# Patient Record
Sex: Female | Born: 1940 | Race: Black or African American | Hispanic: No | Marital: Single | Smoking: Former smoker
Health system: Southern US, Community
[De-identification: ages and names within clinical notes are randomized; demographics above are authoritative.]

## PROBLEM LIST (undated history)

## (undated) DIAGNOSIS — F0390 Unspecified dementia without behavioral disturbance: Secondary | ICD-10-CM

## (undated) DIAGNOSIS — I10 Essential (primary) hypertension: Secondary | ICD-10-CM

---

## 1999-07-03 ENCOUNTER — Ambulatory Visit (HOSPITAL_COMMUNITY): Admission: RE | Admit: 1999-07-03 | Discharge: 1999-07-03 | Payer: Self-pay | Admitting: *Deleted

## 2011-04-12 ENCOUNTER — Emergency Department: Payer: Self-pay | Admitting: Emergency Medicine

## 2012-06-12 ENCOUNTER — Emergency Department: Payer: Self-pay | Admitting: Emergency Medicine

## 2012-10-20 ENCOUNTER — Emergency Department: Payer: Self-pay | Admitting: Emergency Medicine

## 2012-10-20 LAB — URINALYSIS, COMPLETE
Bilirubin,UR: NEGATIVE
Blood: NEGATIVE
Glucose,UR: NEGATIVE mg/dL (ref 0–75)
Ketone: NEGATIVE
Leukocyte Esterase: NEGATIVE
Nitrite: NEGATIVE
Ph: 6 (ref 4.5–8.0)
Protein: NEGATIVE
RBC,UR: <1 /HPF (ref 0–5)
Specific Gravity: 1.018 (ref 1.003–1.030)
Squamous Epithelial: 1
WBC UR: 1 /HPF (ref 0–5)

## 2012-10-20 LAB — COMPREHENSIVE METABOLIC PANEL
Albumin: 3.5 g/dL (ref 3.4–5.0)
Alkaline Phosphatase: 69 U/L (ref 50–136)
Anion Gap: 6 — ABNORMAL LOW (ref 7–16)
BUN: 14 mg/dL (ref 7–18)
Bilirubin,Total: 0.3 mg/dL (ref 0.2–1.0)
Calcium, Total: 9 mg/dL (ref 8.5–10.1)
Chloride: 107 mmol/L (ref 98–107)
Co2: 26 mmol/L (ref 21–32)
Creatinine: 0.65 mg/dL (ref 0.60–1.30)
EGFR (African American): >60
EGFR (Non-African Amer.): >60
Glucose: 100 mg/dL — ABNORMAL HIGH (ref 65–99)
Osmolality: 278 (ref 275–301)
Potassium: 3.9 mmol/L (ref 3.5–5.1)
SGOT(AST): 22 U/L (ref 15–37)
SGPT (ALT): 16 U/L (ref 12–78)
Sodium: 139 mmol/L (ref 136–145)
Total Protein: 7.7 g/dL (ref 6.4–8.2)

## 2012-10-20 LAB — CBC
HCT: 35.7 % (ref 35.0–47.0)
HGB: 11.2 g/dL — ABNORMAL LOW (ref 12.0–16.0)
MCH: 22.5 pg — ABNORMAL LOW (ref 26.0–34.0)
MCHC: 31.3 g/dL — ABNORMAL LOW (ref 32.0–36.0)
MCV: 72 fL — ABNORMAL LOW (ref 80–100)
Platelet: 318 10*3/uL (ref 150–440)
RBC: 4.97 10*6/uL (ref 3.80–5.20)
RDW: 15.1 % — ABNORMAL HIGH (ref 11.5–14.5)
WBC: 9.2 10*3/uL (ref 3.6–11.0)

## 2012-10-20 LAB — TROPONIN I: Troponin-I: <0.02 ng/mL

## 2012-10-20 LAB — CK TOTAL AND CKMB (NOT AT ARMC)
CK, Total: 103 U/L (ref 21–215)
CK-MB: 1.4 ng/mL (ref 0.5–3.6)

## 2012-10-21 ENCOUNTER — Emergency Department: Payer: Self-pay | Admitting: Internal Medicine

## 2012-12-01 ENCOUNTER — Emergency Department: Payer: Self-pay | Admitting: Emergency Medicine

## 2014-09-27 ENCOUNTER — Emergency Department: Payer: Self-pay | Admitting: Emergency Medicine

## 2014-09-27 LAB — BASIC METABOLIC PANEL
Anion Gap: 8 (ref 7–16)
BUN: 19 mg/dL
Calcium, Total: 9.7 mg/dL
Chloride: 107 mmol/L
Co2: 26 mmol/L
Creatinine: 0.77 mg/dL
EGFR (African American): >60
EGFR (Non-African Amer.): >60
Glucose: 120 mg/dL — ABNORMAL HIGH
Potassium: 3.6 mmol/L
Sodium: 141 mmol/L

## 2014-09-27 LAB — URINALYSIS, COMPLETE
Bacteria: NONE SEEN
Bilirubin,UR: NEGATIVE
Blood: NEGATIVE
Glucose,UR: NEGATIVE mg/dL (ref 0–75)
Ketone: NEGATIVE
Leukocyte Esterase: NEGATIVE
Nitrite: NEGATIVE
Ph: 6 (ref 4.5–8.0)
Protein: 30
RBC,UR: <1 /HPF (ref 0–5)
Specific Gravity: 1.013 (ref 1.003–1.030)
Squamous Epithelial: 1
WBC UR: 3 /HPF (ref 0–5)

## 2014-09-27 LAB — CBC
HCT: 38.5 % (ref 35.0–47.0)
HGB: 12 g/dL (ref 12.0–16.0)
MCH: 22.1 pg — ABNORMAL LOW (ref 26.0–34.0)
MCHC: 31.2 g/dL — ABNORMAL LOW (ref 32.0–36.0)
MCV: 71 fL — ABNORMAL LOW (ref 80–100)
Platelet: 329 10*3/uL (ref 150–440)
RBC: 5.43 10*6/uL — ABNORMAL HIGH (ref 3.80–5.20)
RDW: 15.5 % — ABNORMAL HIGH (ref 11.5–14.5)
WBC: 8.9 10*3/uL (ref 3.6–11.0)

## 2014-09-27 LAB — TROPONIN I: Troponin-I: <0.03 ng/mL

## 2014-10-03 ENCOUNTER — Emergency Department
Admit: 2014-10-03 | Disposition: A | Discharge status: Home / Self Care | Payer: Self-pay | Admitting: Emergency Medicine
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
## 2022-03-16 ENCOUNTER — Emergency Department: Payer: Medicare Other

## 2022-03-16 ENCOUNTER — Emergency Department
Admission: EM | Admit: 2022-03-16 | Discharge: 2022-03-17 | Disposition: A | Discharge status: Home / Self Care | Payer: Medicare Other | Attending: Emergency Medicine | Admitting: Emergency Medicine

## 2022-03-16 DIAGNOSIS — R5383 Other fatigue: Secondary | ICD-10-CM | POA: Diagnosis not present

## 2022-03-16 DIAGNOSIS — Z20822 Contact with and (suspected) exposure to covid-19: Secondary | ICD-10-CM | POA: Diagnosis not present

## 2022-03-16 DIAGNOSIS — F0390 Unspecified dementia without behavioral disturbance: Secondary | ICD-10-CM | POA: Diagnosis not present

## 2022-03-16 DIAGNOSIS — R112 Nausea with vomiting, unspecified: Secondary | ICD-10-CM | POA: Insufficient documentation

## 2022-03-16 DIAGNOSIS — R4182 Altered mental status, unspecified: Secondary | ICD-10-CM | POA: Insufficient documentation

## 2022-03-16 DIAGNOSIS — R197 Diarrhea, unspecified: Secondary | ICD-10-CM | POA: Insufficient documentation

## 2022-03-16 HISTORY — DX: Essential (primary) hypertension: I10

## 2022-03-16 HISTORY — DX: Unspecified dementia, unspecified severity, without behavioral disturbance, psychotic disturbance, mood disturbance, and anxiety: F03.90

## 2022-03-16 LAB — BASIC METABOLIC PANEL
Anion gap: 10 (ref 5–15)
BUN: 17 mg/dL (ref 8–23)
CO2: 24 mmol/L (ref 22–32)
Calcium: 9.5 mg/dL (ref 8.9–10.3)
Chloride: 103 mmol/L (ref 98–111)
Creatinine, Ser: 0.93 mg/dL (ref 0.44–1.00)
GFR, Estimated: >60 mL/min (ref >60)
Glucose, Bld: 115 mg/dL — ABNORMAL HIGH (ref 70–99)
Potassium: 4.1 mmol/L (ref 3.5–5.1)
Sodium: 137 mmol/L (ref 135–145)

## 2022-03-16 LAB — CBC
HCT: 34 % — ABNORMAL LOW (ref 36.0–46.0)
Hemoglobin: 10.2 g/dL — ABNORMAL LOW (ref 12.0–15.0)
MCH: 21.7 pg — ABNORMAL LOW (ref 26.0–34.0)
MCHC: 30 g/dL (ref 30.0–36.0)
MCV: 72.5 fL — ABNORMAL LOW (ref 80.0–100.0)
Platelets: 353 10*3/uL (ref 150–400)
RBC: 4.69 MIL/uL (ref 3.87–5.11)
RDW: 15.9 % — ABNORMAL HIGH (ref 11.5–15.5)
WBC: 8.9 10*3/uL (ref 4.0–10.5)
nRBC: 0 % (ref 0.0–0.2)

## 2022-03-16 LAB — HEPATIC FUNCTION PANEL
ALT: 10 U/L (ref 0–44)
AST: 17 U/L (ref 15–41)
Albumin: 4 g/dL (ref 3.5–5.0)
Alkaline Phosphatase: 61 U/L (ref 38–126)
Bilirubin, Direct: <0.1 mg/dL (ref 0.0–0.2)
Total Bilirubin: 0.7 mg/dL (ref 0.3–1.2)
Total Protein: 8 g/dL (ref 6.5–8.1)

## 2022-03-16 LAB — URINALYSIS, ROUTINE W REFLEX MICROSCOPIC
Bilirubin Urine: NEGATIVE
Glucose, UA: NEGATIVE mg/dL
Hgb urine dipstick: NEGATIVE
Ketones, ur: NEGATIVE mg/dL
Leukocytes,Ua: NEGATIVE
Nitrite: NEGATIVE
Protein, ur: NEGATIVE mg/dL
Specific Gravity, Urine: 1.004 — ABNORMAL LOW (ref 1.005–1.030)
pH: 7 (ref 5.0–8.0)

## 2022-03-16 LAB — LIPASE, BLOOD: Lipase: 45 U/L (ref 11–51)

## 2022-03-16 LAB — RESP PANEL BY RT-PCR (FLU A&B, COVID) ARPGX2
Influenza A by PCR: NEGATIVE
Influenza B by PCR: NEGATIVE
SARS Coronavirus 2 by RT PCR: NEGATIVE

## 2022-03-16 MED ORDER — ONDANSETRON 4 MG PO TBDP: 4.0000 mg | ORAL_TABLET | Freq: Three times a day (TID) | ORAL | 0 refills | Status: DC | PRN

## 2022-03-16 MED ORDER — ONDANSETRON HCL 4 MG/2ML IJ SOLN
4.0000 mg | Freq: Once | INTRAMUSCULAR | Status: AC
  Administered 2022-03-16: 4 mg via INTRAVENOUS
  Filled 2022-03-16: qty 2

## 2022-03-16 MED ORDER — HALOPERIDOL LACTATE 5 MG/ML IJ SOLN
2.5000 mg | Freq: Once | INTRAMUSCULAR | Status: AC
  Administered 2022-03-16: 2.5 mg via INTRAVENOUS
  Filled 2022-03-16: qty 1

## 2022-03-16 MED ORDER — IOHEXOL 300 MG/ML  SOLN
100.0000 mL | Freq: Once | INTRAMUSCULAR | Status: AC | PRN
  Administered 2022-03-16: 100 mL via INTRAVENOUS

## 2022-03-16 MED ORDER — HALOPERIDOL LACTATE 5 MG/ML IJ SOLN
2.5000 mg | Freq: Once | INTRAMUSCULAR | Status: AC
  Administered 2022-03-16: 2.5 mg via INTRAVENOUS

## 2022-03-16 MED ORDER — LORAZEPAM 2 MG/ML IJ SOLN
0.5000 mg | Freq: Once | INTRAMUSCULAR | Status: AC
  Administered 2022-03-16: 0.5 mg via INTRAVENOUS
  Filled 2022-03-16: qty 1

## 2022-03-16 MED ORDER — LORAZEPAM 2 MG/ML IJ SOLN
1.0000 mg | Freq: Once | INTRAMUSCULAR | Status: AC
  Administered 2022-03-16: 1 mg via INTRAVENOUS

## 2022-03-16 MED ORDER — SODIUM CHLORIDE 0.9 % IV BOLUS
500.0000 mL | Freq: Once | INTRAVENOUS | Status: AC
  Administered 2022-03-16: 500 mL via INTRAVENOUS

## 2022-03-16 NOTE — ED Triage Notes (Signed)
Pt presents to ED from home accompanied by daughter. Daughter reports change in patient behavior and has noticed n/v/d and increased generalized weakness X 1 week.

## 2022-03-16 NOTE — ED Notes (Signed)
This RN attempted to assist the patient to the [facility] for urine specimen, Pt refusing to allow this RN to assist and becomes upset with the patients daughter (present in the room) for assisting her with her pants. EDP notified of the patients behavior and is present at the bedside.

## 2022-03-16 NOTE — ED Notes (Signed)
Pt taken to CT by this RN - tolerated well, family at the bedside.

## 2022-03-16 NOTE — Discharge Instructions (Signed)
Take Zofran as prescribed for nausea as needed.  Stay hydrated by drinking plenty of fluids.  Fortunately, our assessment in the emergency department showed no emergencies that could explain your symptoms today.  Please work with your daughter as you have been doing to find a safe place for you to live.  Thank you for choosing [place] for your health care today!  Please see your primary doctor this week for a follow up appointment.   If you do not have a primary doctor call the following clinics to establish care:  If you have insurance:  [facility] [phone] [address] [place] [place] [address]   [facility]  [phone] [address] [place] [place] [address]   If you do not have insurance:  [facility]  [phone] [address] [place] [address]  Sometimes, in the early stages of certain disease courses it is difficult to detect in the emergency department evaluation -- so, it is important that you continue to monitor your symptoms and call your doctor right away or return to the emergency department if you develop any new or worsening symptoms.  It was my pleasure to care for you today.   Daneil Dan Modesto Charon, MD

## 2022-03-16 NOTE — ED Provider Notes (Signed)
[facility] Provider Note    Event Date/Time   First MD Initiated Contact with Patient 03/16/22 1759     (approximate)   History   Emesis   HPI  Sandra Greene is a 81 y.o. female   Past medical history of dementia lives at home with daughter. Presents with 1 week lethargy fatigue AMS progressive and several days n/v/d no blood. No other complaints patient.  The daughter who is the primary caretaker at home states that the patient has had chronic progressive worsening of her dementia and lashes out especially towards the evening time.  She has no medications for her dementia and she has been working to get the patient placed in a facility without success.   Denies trauma, fever, dysuria.   History was obtained via limited history obtained from patient with baseline dementia, daughter is medical decision-maker and is at bedside to provide most of the history.      Physical Exam   Triage Vital Signs: ED Triage Vitals  Enc Vitals Group     BP 03/16/22 1710 (!) 145/55     Pulse Rate 03/16/22 1710 66     Resp 03/16/22 1710 16     Temp 03/16/22 1710 97.8 F (36.6 C)     Temp Source 03/16/22 1710 Oral     SpO2 03/16/22 1710 97 %     Weight 03/16/22 1946 148 lb 9.4 oz (67.4 kg)     Height 03/16/22 1946 5\' 3"  (1.6 m)     Head Circumference --      Peak Flow --      Pain Score --      Pain Loc --      Pain Edu? --      Excl. in GC? --     Most recent vital signs: Vitals:   03/16/22 2130 03/16/22 2300  BP: 108/61 100/66  Pulse: 68 65  Resp: 16 16  Temp:    SpO2: 99% 98%    General: Awake, no distress.  CV:  Good peripheral perfusion.  Resp:  Normal effort.  Lungs clear to auscultation bilaterally Abd:  No distention.  Nontender. Other:  Oriented to self, not situation.  Follows commands and moves all extremities.   ED Results / Procedures / Treatments   Labs (all labs ordered are listed, but only abnormal results are  displayed) Labs Reviewed  BASIC METABOLIC PANEL - Abnormal; Notable for the following components:      Result Value   Glucose, Bld 115 (*)    All other components within normal limits  CBC - Abnormal; Notable for the following components:   Hemoglobin 10.2 (*)    HCT 34.0 (*)    MCV 72.5 (*)    MCH 21.7 (*)    RDW 15.9 (*)    All other components within normal limits  URINALYSIS, ROUTINE W REFLEX MICROSCOPIC - Abnormal; Notable for the following components:   Color, Urine COLORLESS (*)    APPearance CLEAR (*)    Specific Gravity, Urine 1.004 (*)    All other components within normal limits  URINE CULTURE  RESP PANEL BY RT-PCR (FLU A&B, COVID) ARPGX2  HEPATIC FUNCTION PANEL  LIPASE, BLOOD  CBG MONITORING, ED     I reviewed labs and they are notable for normal blood glucose, mild anemia 10.2, no leukocytosis and a urinalysis that is not consistent with urinary tract infection  EKG  ED ECG REPORT I, 05/16/22, the attending physician,  personally viewed and interpreted this ECG.   Date: 03/16/2022  EKG Time: 1904  Rate: 71  Rhythm: normal sinus rhythm, frequent PVC's noted  Axis: normal  Intervals:none  ST&T Change: TWI V5-6; not new    RADIOLOGY I independently reviewed and interpreted CXR and see no focal consolidation   PROCEDURES:  Critical Care performed: No  Procedures   MEDICATIONS ORDERED IN ED: Medications  ondansetron (ZOFRAN) injection 4 mg (4 mg Intravenous Given 03/16/22 1945)  sodium chloride 0.9 % bolus 500 mL (0 mLs Intravenous Stopped 03/16/22 2015)  iohexol (OMNIPAQUE) 300 MG/ML solution 100 mL (100 mLs Intravenous Contrast Given 03/16/22 2119)  haloperidol lactate (HALDOL) injection 2.5 mg (2.5 mg Intravenous Given 03/16/22 2040)  LORazepam (ATIVAN) injection 0.5 mg (0.5 mg Intravenous Given 03/16/22 2040)  haloperidol lactate (HALDOL) injection 2.5 mg (2.5 mg Intravenous Given 03/16/22 2100)  LORazepam (ATIVAN) injection 1 mg (1 mg Intravenous  Given 03/16/22 2100)    IMPRESSION / MDM / ASSESSMENT AND PLAN / ED COURSE  I reviewed the triage vital signs and the nursing notes.                              Differential diagnosis includes, but is not limited to, intraabdominal infection, UTI, pna, electrolyte disturbance, AKI, or progression of dementia.    MDM:  work up unremarkable. Pt agitated toward evening time, as she has been at home. Given IV haldol and ativan since she was trying to walk out, confirmed with medical decision maker daughter at bedside.   Plan for [facility] for placement given concerns for safety at home with sole caretaker daughter.    Patient's presentation is most consistent with acute complicated illness / injury requiring diagnostic workup.       FINAL CLINICAL IMPRESSION(S) / ED DIAGNOSES   Final diagnoses:  Nausea vomiting and diarrhea  Altered mental status, unspecified altered mental status type     Rx / DC Orders   ED Discharge Orders          Ordered    ondansetron (ZOFRAN-ODT) 4 MG disintegrating tablet  Every 8 hours PRN        03/16/22 2147             Note:  This document was prepared using Dragon voice recognition software and may include unintentional dictation errors.    Pilar Jarvis, MD 03/16/22 2312

## 2022-03-16 NOTE — ED Notes (Signed)
Pt still agitated and uncooperative with providing urine sample and going to CT scan - EDP [place] Charon) notified - see MAR for intervention.

## 2022-03-17 MED ORDER — ONDANSETRON 4 MG PO TBDP: 4.0000 mg | ORAL_TABLET | Freq: Three times a day (TID) | ORAL | 0 refills | Status: AC | PRN

## 2022-03-17 NOTE — ED Provider Notes (Signed)
Emergency Medicine Observation Re-evaluation Note  Sandra Greene is a 81 y.o. female, seen on rounds today.  Pt initially presented to the ED for complaints of Emesis  Currently, the patient is calm, no acute complaints.  Physical Exam  Blood pressure (!) 141/56, pulse 67, temperature 98 F (36.7 C), temperature source Oral, resp. rate 18, height 5\' 3"  (1.6 m), weight 67.4 kg, SpO2 100 %. Physical Exam General: NAD Lungs: CTAB Psych: not agitated  ED Course / MDM  EKG:    I have reviewed the labs performed to date as well as medications administered while in observation.  Recent changes in the last 24 hours include no acute events overnight.  Lab workup unremarkable  Plan  Current plan is for PT eval, TOC placement.   , MD 03/17/22 [phone]

## 2022-03-17 NOTE — ED Provider Notes (Signed)
Procedures     ----------------------------------------- 5:42 AM on 03/17/2022 -----------------------------------------  Daughter has returned to the ED and now requests to take her mother home.  She has noted that she has been seeking placement in the outpatient setting and believes that admission to [facility] is available today.    Sharman Cheek, MD 03/17/22 [phone]

## 2022-03-17 NOTE — ED Notes (Signed)
Pts daughter discussed with this RN her desire to take the patient home since she is much calmer and more cooperative. EDP notified of the patient's family wishes and pt assisted to the restroom and wheeled to her vehicle. E-signature pad unavailable - Pts daughter verbalized understanding of D/C information - no additional concerns at this time.

## 2022-03-18 LAB — URINE CULTURE
